# Patient Record
Sex: Male | Born: 1985 | Hispanic: No | Marital: Single | Smoking: Never smoker
Health system: Southern US, Community
[De-identification: ages and names within clinical notes are randomized; demographics above are authoritative.]

## PROBLEM LIST (undated history)

## (undated) DIAGNOSIS — K219 Gastro-esophageal reflux disease without esophagitis: Secondary | ICD-10-CM

## (undated) DIAGNOSIS — J342 Deviated nasal septum: Secondary | ICD-10-CM

## (undated) DIAGNOSIS — L649 Androgenic alopecia, unspecified: Secondary | ICD-10-CM

## (undated) DIAGNOSIS — J320 Chronic maxillary sinusitis: Secondary | ICD-10-CM

## (undated) DIAGNOSIS — J322 Chronic ethmoidal sinusitis: Secondary | ICD-10-CM

## (undated) DIAGNOSIS — J343 Hypertrophy of nasal turbinates: Secondary | ICD-10-CM

## (undated) HISTORY — PX: NO PAST SURGERIES: SHX2092

---

## 2013-02-24 ENCOUNTER — Emergency Department (HOSPITAL_COMMUNITY)
Admission: EM | Admit: 2013-02-24 | Discharge: 2013-02-24 | Disposition: A | Discharge status: Home / Self Care | Payer: BC Managed Care – PPO | Source: Home / Self Care | Attending: Emergency Medicine | Admitting: Emergency Medicine

## 2013-02-24 ENCOUNTER — Encounter (HOSPITAL_COMMUNITY): Payer: Self-pay | Admitting: Emergency Medicine

## 2013-02-24 DIAGNOSIS — L658 Other specified nonscarring hair loss: Secondary | ICD-10-CM

## 2013-02-24 DIAGNOSIS — L649 Androgenic alopecia, unspecified: Secondary | ICD-10-CM

## 2013-02-24 HISTORY — DX: Androgenic alopecia, unspecified: L64.9

## 2013-02-24 MED ORDER — FINASTERIDE 5 MG PO TABS: ORAL_TABLET | ORAL | Status: DC

## 2013-02-24 NOTE — ED Notes (Signed)
Requesting refill of Proscar.

## 2013-02-24 NOTE — ED Provider Notes (Signed)
Chief Complaint:   Chief Complaint  Patient presents with  . 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 Your ago he was given finasteride 5 mg which she takes one fourth tablet daily. He's been tolerating this well and this caused the hair loss to stop. He denies any side effects such as decreased libido or erectile dysfunction.  Review of Systems:  Other than noted above, the patient denies any of the following symptoms. Systemic:  No fever, chills, sweats, fatigue, myalgias, headache, or anorexia. Eye:  No redness, pain or drainage. ENT:  No earache, nasal congestion, rhinorrhea, sinus pressure, or sore throat. Lungs:  No cough, sputum production, wheezing, shortness of breath.  Cardiovascular:  No chest pain, palpitations, or syncope. GI:  No nausea, vomiting, abdominal pain or diarrhea. GU:  No dysuria, frequency, or hematuria. Skin:  No rash or pruritis.  PMFSH:  Past medical history, family history, social history, meds, and allergies were reviewed.   Physical Exam:   Vital signs:  BP 145/78  Pulse 55  Temp(Src) 98.5 F (36.9 C) (Oral)  Resp 16  SpO2 100% General:  Alert, in no distress. Eye:  PERRL, full EOMs.  Lids and conjunctivas were normal. ENT:  TMs and canals were normal, without erythema or inflammation.  Nasal mucosa was clear and uncongested, without drainage.  Mucous membranes were moist.  Pharynx was clear, without exudate or drainage.  There were no oral ulcerations or lesions. Neck:  Supple, no adenopathy, tenderness or mass. Thyroid was normal. Lungs:  No respiratory distress.  Lungs were clear to auscultation, without wheezes, rales or rhonchi.  Breath sounds were clear and equal bilaterally. Heart:  Regular rhythm, without gallops, murmers or rubs. Abdomen:  Soft, flat, and non-tender to palpation.  No hepatosplenomagaly or  mass. Skin:  Clear, warm, and dry, without rash or lesions.  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  Plan:   1.  The following meds were prescribed:   Discharge Medication List as of 02/24/2013  2:02 PM    START taking these medications   Details  finasteride (PROSCAR) 5 MG tablet 1/4 tablet daily, Normal       2.  The patient was instructed in symptomatic care and handouts were given. 3.  The patient was told to return to a primary care physician for refills.   Reuben Likes, MD 02/24/13 [phone]

## 2014-08-29 ENCOUNTER — Other Ambulatory Visit (INDEPENDENT_AMBULATORY_CARE_PROVIDER_SITE_OTHER): Payer: Self-pay | Admitting: Otolaryngology

## 2014-08-29 ENCOUNTER — Ambulatory Visit
Admission: RE | Admit: 2014-08-29 | Discharge: 2014-08-29 | Disposition: A | Discharge status: Home / Self Care | Payer: BC Managed Care – PPO | Source: Ambulatory Visit | Attending: Otolaryngology | Admitting: Otolaryngology

## 2014-08-29 DIAGNOSIS — J328 Other chronic sinusitis: Secondary | ICD-10-CM

## 2014-08-31 DIAGNOSIS — J343 Hypertrophy of nasal turbinates: Secondary | ICD-10-CM

## 2014-08-31 DIAGNOSIS — J342 Deviated nasal septum: Secondary | ICD-10-CM

## 2014-08-31 DIAGNOSIS — J320 Chronic maxillary sinusitis: Secondary | ICD-10-CM

## 2014-08-31 DIAGNOSIS — J322 Chronic ethmoidal sinusitis: Secondary | ICD-10-CM

## 2014-08-31 HISTORY — DX: Chronic maxillary sinusitis: J32.0

## 2014-08-31 HISTORY — DX: Chronic ethmoidal sinusitis: J32.2

## 2014-08-31 HISTORY — DX: Deviated nasal septum: J34.2

## 2014-08-31 HISTORY — DX: Hypertrophy of nasal turbinates: J34.3

## 2014-09-09 ENCOUNTER — Other Ambulatory Visit: Payer: Self-pay | Admitting: Otolaryngology

## 2014-09-19 ENCOUNTER — Encounter (HOSPITAL_BASED_OUTPATIENT_CLINIC_OR_DEPARTMENT_OTHER): Payer: Self-pay | Admitting: *Deleted

## 2014-09-29 ENCOUNTER — Ambulatory Visit (HOSPITAL_BASED_OUTPATIENT_CLINIC_OR_DEPARTMENT_OTHER): Payer: BC Managed Care – PPO | Admitting: Anesthesiology

## 2014-09-29 ENCOUNTER — Encounter (HOSPITAL_BASED_OUTPATIENT_CLINIC_OR_DEPARTMENT_OTHER): Payer: Self-pay | Admitting: Anesthesiology

## 2014-09-29 ENCOUNTER — Encounter (HOSPITAL_BASED_OUTPATIENT_CLINIC_OR_DEPARTMENT_OTHER)
Admission: RE | Disposition: A | Discharge status: Home / Self Care | Payer: Self-pay | Source: Ambulatory Visit | Attending: Otolaryngology

## 2014-09-29 ENCOUNTER — Ambulatory Visit (HOSPITAL_BASED_OUTPATIENT_CLINIC_OR_DEPARTMENT_OTHER)
Admission: RE | Admit: 2014-09-29 | Discharge: 2014-09-29 | Disposition: A | Discharge status: Home / Self Care | Payer: BC Managed Care – PPO | Source: Ambulatory Visit | Attending: Otolaryngology | Admitting: Otolaryngology

## 2014-09-29 DIAGNOSIS — R0981 Nasal congestion: Secondary | ICD-10-CM | POA: Diagnosis not present

## 2014-09-29 DIAGNOSIS — J321 Chronic frontal sinusitis: Secondary | ICD-10-CM | POA: Insufficient documentation

## 2014-09-29 DIAGNOSIS — Z792 Long term (current) use of antibiotics: Secondary | ICD-10-CM | POA: Diagnosis not present

## 2014-09-29 DIAGNOSIS — J320 Chronic maxillary sinusitis: Secondary | ICD-10-CM | POA: Diagnosis not present

## 2014-09-29 DIAGNOSIS — J328 Other chronic sinusitis: Secondary | ICD-10-CM | POA: Insufficient documentation

## 2014-09-29 DIAGNOSIS — J342 Deviated nasal septum: Secondary | ICD-10-CM | POA: Insufficient documentation

## 2014-09-29 DIAGNOSIS — J343 Hypertrophy of nasal turbinates: Secondary | ICD-10-CM | POA: Insufficient documentation

## 2014-09-29 DIAGNOSIS — J322 Chronic ethmoidal sinusitis: Secondary | ICD-10-CM | POA: Diagnosis not present

## 2014-09-29 HISTORY — PX: NASAL SEPTOPLASTY W/ TURBINOPLASTY: SHX2070

## 2014-09-29 HISTORY — PX: SINUS ENDO WITH FUSION: SHX5329

## 2014-09-29 HISTORY — DX: Gastro-esophageal reflux disease without esophagitis: K21.9

## 2014-09-29 HISTORY — PX: SINUS TREPHINING FRONTAL: SHX5216

## 2014-09-29 HISTORY — DX: Chronic ethmoidal sinusitis: J32.2

## 2014-09-29 HISTORY — DX: Chronic maxillary sinusitis: J32.0

## 2014-09-29 HISTORY — DX: Hypertrophy of nasal turbinates: J34.3

## 2014-09-29 HISTORY — DX: Deviated nasal septum: J34.2

## 2014-09-29 LAB — POCT HEMOGLOBIN-HEMACUE: HEMOGLOBIN: 14.5 g/dL (ref 13.0–17.0)

## 2014-09-29 SURGERY — SEPTOPLASTY, NOSE, WITH NASAL TURBINATE REDUCTION
Anesthesia: General | Site: Nose

## 2014-09-29 MED ORDER — FENTANYL CITRATE 0.05 MG/ML IJ SOLN
INTRAMUSCULAR | Status: DC | PRN
  Administered 2014-09-29: 100 ug via INTRAVENOUS

## 2014-09-29 MED ORDER — MIDAZOLAM HCL 2 MG/2ML IJ SOLN
INTRAMUSCULAR | Status: AC
  Filled 2014-09-29: qty 2

## 2014-09-29 MED ORDER — BACITRACIN ZINC 500 UNIT/GM EX OINT
TOPICAL_OINTMENT | CUTANEOUS | Status: AC
Start: 2014-09-29 — End: 2014-09-29
  Filled 2014-09-29: qty 28.35

## 2014-09-29 MED ORDER — OXYMETAZOLINE HCL 0.05 % NA SOLN
NASAL | Status: DC | PRN
  Administered 2014-09-29: 1 via NASAL

## 2014-09-29 MED ORDER — OXYCODONE HCL 5 MG PO TABS
ORAL_TABLET | ORAL | Status: AC
  Filled 2014-09-29: qty 1

## 2014-09-29 MED ORDER — OXYCODONE HCL 5 MG/5ML PO SOLN: 5.0000 mg | Freq: Once | ORAL | Status: AC | PRN

## 2014-09-29 MED ORDER — PROPOFOL 10 MG/ML IV BOLUS
INTRAVENOUS | Status: DC | PRN
  Administered 2014-09-29: 200 mg via INTRAVENOUS

## 2014-09-29 MED ORDER — HYDROMORPHONE HCL 1 MG/ML IJ SOLN
0.2500 mg | INTRAMUSCULAR | Status: DC | PRN
  Administered 2014-09-29: 0.5 mg via INTRAVENOUS

## 2014-09-29 MED ORDER — MIDAZOLAM HCL 5 MG/5ML IJ SOLN
INTRAMUSCULAR | Status: DC | PRN
  Administered 2014-09-29: 2 mg via INTRAVENOUS

## 2014-09-29 MED ORDER — OXYCODONE-ACETAMINOPHEN 5-325 MG PO TABS: 1.0000 | ORAL_TABLET | ORAL | Status: AC | PRN

## 2014-09-29 MED ORDER — SUCCINYLCHOLINE CHLORIDE 20 MG/ML IJ SOLN
INTRAMUSCULAR | Status: DC | PRN
  Administered 2014-09-29: 100 mg via INTRAVENOUS

## 2014-09-29 MED ORDER — HYDROMORPHONE HCL 1 MG/ML IJ SOLN
INTRAMUSCULAR | Status: AC
  Filled 2014-09-29: qty 1

## 2014-09-29 MED ORDER — MIDAZOLAM HCL 2 MG/ML PO SYRP: 12.0000 mg | ORAL_SOLUTION | Freq: Once | ORAL | Status: DC | PRN

## 2014-09-29 MED ORDER — MUPIROCIN 2 % EX OINT
TOPICAL_OINTMENT | CUTANEOUS | Status: AC
  Filled 2014-09-29: qty 22

## 2014-09-29 MED ORDER — SODIUM CHLORIDE 0.9 % IV SOLN
INTRAVENOUS | Status: DC | PRN
  Administered 2014-09-29: 500 mL via INTRAMUSCULAR

## 2014-09-29 MED ORDER — MUPIROCIN 2 % EX OINT
TOPICAL_OINTMENT | CUTANEOUS | Status: DC | PRN
  Administered 2014-09-29: 1 via NASAL

## 2014-09-29 MED ORDER — FENTANYL CITRATE 0.05 MG/ML IJ SOLN
INTRAMUSCULAR | Status: AC
  Filled 2014-09-29: qty 4

## 2014-09-29 MED ORDER — CEFAZOLIN SODIUM-DEXTROSE 2-3 GM-% IV SOLR
INTRAVENOUS | Status: DC | PRN
  Administered 2014-09-29: 2 g via INTRAVENOUS

## 2014-09-29 MED ORDER — OXYMETAZOLINE HCL 0.05 % NA SOLN
NASAL | Status: AC
  Filled 2014-09-29: qty 15

## 2014-09-29 MED ORDER — PROMETHAZINE HCL 25 MG/ML IJ SOLN: 6.2500 mg | INTRAMUSCULAR | Status: DC | PRN

## 2014-09-29 MED ORDER — MEPERIDINE HCL 25 MG/ML IJ SOLN: 6.2500 mg | INTRAMUSCULAR | Status: DC | PRN

## 2014-09-29 MED ORDER — DIPHENHYDRAMINE HCL 50 MG/ML IJ SOLN: 12.5000 mg | Freq: Once | INTRAMUSCULAR | Status: DC

## 2014-09-29 MED ORDER — LIDOCAINE-EPINEPHRINE 1 %-1:100000 IJ SOLN
INTRAMUSCULAR | Status: DC | PRN
  Administered 2014-09-29: 3 mL

## 2014-09-29 MED ORDER — AMOXICILLIN 875 MG PO TABS: 875.0000 mg | ORAL_TABLET | Freq: Two times a day (BID) | ORAL | Status: AC

## 2014-09-29 MED ORDER — OXYCODONE HCL 5 MG PO TABS
5.0000 mg | ORAL_TABLET | Freq: Once | ORAL | Status: AC | PRN
  Administered 2014-09-29: 5 mg via ORAL

## 2014-09-29 MED ORDER — DEXAMETHASONE SODIUM PHOSPHATE 4 MG/ML IJ SOLN
INTRAMUSCULAR | Status: DC | PRN
  Administered 2014-09-29: 10 mg via INTRAVENOUS

## 2014-09-29 MED ORDER — CEFAZOLIN SODIUM-DEXTROSE 2-3 GM-% IV SOLR
INTRAVENOUS | Status: AC
  Filled 2014-09-29: qty 50

## 2014-09-29 MED ORDER — ONDANSETRON HCL 4 MG/2ML IJ SOLN
INTRAMUSCULAR | Status: DC | PRN
  Administered 2014-09-29: 4 mg via INTRAVENOUS

## 2014-09-29 MED ORDER — LIDOCAINE-EPINEPHRINE 1 %-1:100000 IJ SOLN
INTRAMUSCULAR | Status: AC
  Filled 2014-09-29: qty 1

## 2014-09-29 MED ORDER — MIDAZOLAM HCL 2 MG/2ML IJ SOLN
1.0000 mg | INTRAMUSCULAR | Status: DC | PRN
Start: 2014-09-29 — End: 2014-09-29

## 2014-09-29 MED ORDER — MIDAZOLAM HCL 2 MG/2ML IJ SOLN: 0.5000 mg | Freq: Once | INTRAMUSCULAR | Status: DC | PRN

## 2014-09-29 MED ORDER — LACTATED RINGERS IV SOLN
INTRAVENOUS | Status: DC
  Administered 2014-09-29 (×3): via INTRAVENOUS

## 2014-09-29 MED ORDER — FENTANYL CITRATE 0.05 MG/ML IJ SOLN: 50.0000 ug | INTRAMUSCULAR | Status: DC | PRN

## 2014-09-29 MED ORDER — LIDOCAINE HCL (CARDIAC) 20 MG/ML IV SOLN
INTRAVENOUS | Status: DC | PRN
  Administered 2014-09-29: 50 mg via INTRAVENOUS

## 2014-09-29 SURGICAL SUPPLY — 62 items
ATTRACTOMAT 16X20 MAGNETIC DRP (DRAPES) IMPLANT
BLADE ROTATE RAD 12 4 M4 (BLADE) IMPLANT
BLADE ROTATE RAD 12 4MM M4 (BLADE)
BLADE ROTATE RAD 40 4 M4 (BLADE) IMPLANT
BLADE ROTATE RAD 40 4MM M4 (BLADE)
BLADE ROTATE TRICUT 4MX13CM M4 (BLADE) ×1
BLADE ROTATE TRICUT 4X13 M4 (BLADE) ×4 IMPLANT
BLADE TRICUT ROTATE M4 4 5PK (BLADE) IMPLANT
BLADE TRICUT ROTATE M4 4MM 5PK (BLADE)
BUR HS RAD FRONTAL 3 (BURR) IMPLANT
BUR HS RAD FRONTAL 3MM (BURR)
CANISTER SUC SOCK COL 7IN (MISCELLANEOUS) IMPLANT
CANISTER SUCT 1200ML W/VALVE (MISCELLANEOUS) ×5 IMPLANT
COAGULATOR SUCT 8FR VV (MISCELLANEOUS) ×5 IMPLANT
DECANTER SPIKE VIAL GLASS SM (MISCELLANEOUS) IMPLANT
DRAPE SURG 17X23 STRL (DRAPES) ×5 IMPLANT
DRSG NASAL KENNEDY LMNT 8CM (GAUZE/BANDAGES/DRESSINGS) IMPLANT
DRSG NASOPORE 8CM (GAUZE/BANDAGES/DRESSINGS) ×5 IMPLANT
DRSG TELFA 3X8 NADH (GAUZE/BANDAGES/DRESSINGS) IMPLANT
ELECT REM PT RETURN 9FT ADLT (ELECTROSURGICAL) ×5
ELECTRODE REM PT RTRN 9FT ADLT (ELECTROSURGICAL) ×3 IMPLANT
GLOVE BIO SURGEON STRL SZ7.5 (GLOVE) ×5 IMPLANT
GLOVE BIOGEL PI IND STRL 7.0 (GLOVE) ×3 IMPLANT
GLOVE BIOGEL PI INDICATOR 7.0 (GLOVE) ×2
GLOVE ECLIPSE 7.0 STRL STRAW (GLOVE) ×5 IMPLANT
GLOVE ECLIPSE 7.5 STRL STRAW (GLOVE) ×5 IMPLANT
GOWN STRL REUS W/ TWL LRG LVL3 (GOWN DISPOSABLE) ×6 IMPLANT
GOWN STRL REUS W/TWL LRG LVL3 (GOWN DISPOSABLE) ×4
HEMOSTAT SURGICEL 2X14 (HEMOSTASIS) IMPLANT
IV NS 1000ML (IV SOLUTION)
IV NS 1000ML BAXH (IV SOLUTION) IMPLANT
IV NS 500ML (IV SOLUTION) ×2
IV NS 500ML BAXH (IV SOLUTION) ×3 IMPLANT
NEEDLE 27GAX1X1/2 (NEEDLE) ×5 IMPLANT
NEEDLE HYPO 25X1 1.5 SAFETY (NEEDLE) ×5 IMPLANT
NEEDLE SPNL 25GX3.5 QUINCKE BL (NEEDLE) IMPLANT
NS IRRIG 1000ML POUR BTL (IV SOLUTION) ×5 IMPLANT
PACK BASIN DAY SURGERY FS (CUSTOM PROCEDURE TRAY) ×5 IMPLANT
PACK ENT DAY SURGERY (CUSTOM PROCEDURE TRAY) ×5 IMPLANT
PAD ENT ADHESIVE 25PK (MISCELLANEOUS) ×5 IMPLANT
SLEEVE SCD COMPRESS KNEE MED (MISCELLANEOUS) ×5 IMPLANT
SOLUTION BUTLER CLEAR DIP (MISCELLANEOUS) ×5 IMPLANT
SPLINT NASAL AIRWAY SILICONE (MISCELLANEOUS) ×5 IMPLANT
SPONGE GAUZE 2X2 8PLY STER LF (GAUZE/BANDAGES/DRESSINGS) ×1
SPONGE GAUZE 2X2 8PLY STRL LF (GAUZE/BANDAGES/DRESSINGS) ×4 IMPLANT
SPONGE NEURO XRAY DETECT 1X3 (DISPOSABLE) ×5 IMPLANT
SUT CHROMIC 4 0 P 3 18 (SUTURE) ×5 IMPLANT
SUT ETHILON 3 0 PS 1 (SUTURE) IMPLANT
SUT PLAIN 4 0 ~~LOC~~ 1 (SUTURE) ×5 IMPLANT
SUT PROLENE 3 0 PS 2 (SUTURE) ×5 IMPLANT
SUT VIC AB 4-0 P-3 18XBRD (SUTURE) IMPLANT
SUT VIC AB 4-0 P3 18 (SUTURE)
SYR 3ML 23GX1 SAFETY (SYRINGE) IMPLANT
TOWEL OR 17X24 6PK STRL BLUE (TOWEL DISPOSABLE) ×10 IMPLANT
TRACKER ENT INSTRUMENT (MISCELLANEOUS) ×5 IMPLANT
TRACKER ENT PATIENT (MISCELLANEOUS) ×5 IMPLANT
TUBE CONNECTING 20'X1/4 (TUBING) ×1
TUBE CONNECTING 20X1/4 (TUBING) ×4 IMPLANT
TUBE SALEM SUMP 12R W/ARV (TUBING) IMPLANT
TUBE SALEM SUMP 16 FR W/ARV (TUBING) ×5 IMPLANT
TUBING STRAIGHTSHOT EPS 5PK (TUBING) ×5 IMPLANT
YANKAUER SUCT BULB TIP NO VENT (SUCTIONS) ×5 IMPLANT

## 2014-09-29 NOTE — Brief Op Note (Signed)
09/29/2014  11:01 AM  PATIENT:  [name] Booker  28 y.o. male  PRE-OPERATIVE DIAGNOSIS:   1. Nasal septal deviation 2. Chronic left frontal sinusitis 3. Chronic bilateral ethmoid sinusitis 4. Chronic bilateral maxillary sinusitis 5. Bilateral inferior turbinate hypertropy  POST-OPERATIVE DIAGNOSIS:  1. Nasal septal deviation 2. Chronic left frontal sinusitis 3. Chronic bilateral ethmoid sinusitis 4. Chronic bilateral maxillary sinusitis 5. Bilateral inferior turbinate hypertropy  PROCEDURE:   1. Endoscopic left frontal sinusotomy  2. Septoplasty 3. Bilateral partial inferior turbinate resection 4. Endoscopic bilateral maxillary antrostomy with polyp removal 5. Endoscopic bilateral anterior ethmoidectomy 6. FUSION stereotactic image guidance  SURGEON:  Surgeon(s) and Role:    * Darletta MollSui W Kashden Deboy, MD - Primary  PHYSICIAN ASSISTANT:   ASSISTANTS: none   ANESTHESIA:   general  EBL:  Total I/O In: 1500 [I.V.:1500] Out: -   BLOOD ADMINISTERED:none  DRAINS: none   LOCAL MEDICATIONS USED:  LIDOCAINE   SPECIMEN:  Source of Specimen:  Bilateral sinus contents  DISPOSITION OF SPECIMEN:  PATHOLOGY  COUNTS:  YES  TOURNIQUET:  * No tourniquets in log *  DICTATION: .Other Dictation: Dictation Number [id number]  PLAN OF CARE: Discharge to home after PACU  PATIENT DISPOSITION:  PACU - hemodynamically stable.   Delay start of Pharmacological VTE agent (>24hrs) due to surgical blood loss or risk of bleeding: not applicable

## 2014-09-29 NOTE — Anesthesia Preprocedure Evaluation (Addendum)
Anesthesia Evaluation  Patient identified by MRN, date of birth, ID band Patient awake    Reviewed: Allergy & Precautions, H&P , NPO status , Patient's Chart, lab work & pertinent test results  History of Anesthesia Complications Negative for: history of anesthetic complications  Airway Mallampati: I  TM Distance: >3 FB Neck ROM: Full    Dental  (+) Teeth Intact, Dental Advisory Given   Pulmonary  Chronic sinusitis breath sounds clear to auscultation        Cardiovascular negative cardio ROS  Rhythm:Regular Rate:Normal     Neuro/Psych negative neurological ROS     GI/Hepatic Neg liver ROS, GERD-  Medicated and Controlled,  Endo/Other  negative endocrine ROS  Renal/GU negative Renal ROS     Musculoskeletal   Abdominal   Peds  Hematology negative hematology ROS (+)   Anesthesia Other Findings   Reproductive/Obstetrics                            Anesthesia Physical Anesthesia Plan  ASA: II  Anesthesia Plan: General   Post-op Pain Management:    Induction: Intravenous  Airway Management Planned: Oral ETT  Additional Equipment:   Intra-op Plan:   Post-operative Plan: Extubation in OR  Informed Consent: I have reviewed the patients History and Physical, chart, labs and discussed the procedure including the risks, benefits and alternatives for the proposed anesthesia with the patient or authorized representative who has indicated his/her understanding and acceptance.   Dental advisory given  Plan Discussed with: Surgeon and CRNA  Anesthesia Plan Comments: (Plan routine monitors, GETA)        Anesthesia Quick Evaluation

## 2014-09-29 NOTE — H&P (Signed)
Cc: Chronic nasal congestion, chronic rhinosinusitis  HPI: The patient is a 28 year old male who returns today for his follow-up evaluation.  The patient has a history of chronic nasal congestion and chronic rhinosinusitis, with recurrent acute symptoms.  He was previously treated with multiple courses of long-term antibiotics and steroids.  However, he continues to be symptomatic.  He recently underwent a sinus CT scan.  The CT showed significant nasal septal deviation, bilateral inferior turbinate hypertrophy, and mucosal thickening and obstruction of the left frontal recess, bilateral anterior ethmoid air cells, and bilateral maxillary openings.  The patient returns reporting no significant improvement in his symptoms.  He continues to have chronic nasal congestion and postnasal drainage.  He is interested in more definitive treatment of his symptoms.   Exam: The nasal cavities were decongested and anesthetised with a combination of oxymetazoline and 4% lidocaine solution.  The flexible scope was inserted into the right nasal cavity.  Endoscopy of the inferior and middle meatus was performed.  Edematous mucosa was noted. Bidirectional nasal septal deviation.  Olfactory cleft was clear.  Nasopharynx was clear.  Turbinates were hypertrophied but without mass.  Incomplete response to decongestion.  The procedure was repeated on the contralateral side with similar findings.  The patient tolerated the procedure well.  Instructions were given to avoid eating or drinking for 2 hours.    Assessment: 1.  The patient continues to have severe nasal mucosal congestion, bidirectional nasal septal deviation, and bilateral inferior turbinate hypertrophy.   2.  His CT scan images are consistent with bilateral chronic maxillary, ethmoid, and left frontal sinusitis.   Plan: 1.  The nasal endoscopy findings and the CT images are extensively discussed with the patient.  2.  The patient should continue with his allergy  treatment regimen.   3.  The options of surgical intervention with septoplasty, turbinate reduction, and endoscopic sinus surgery are discussed.    4.  The risks, benefits, alternatives and details of the procedure are reviewed.  5.  The patient is interested in proceeding with the procedures.  We will schedule the procedure in accordance with the patient's schedule.

## 2014-09-29 NOTE — Discharge Instructions (Signed)
°Post Anesthesia Home Care Instructions ° °Activity: °Get plenty of rest for the remainder of the day. A responsible adult should stay with you for 24 hours following the procedure.  °For the next 24 hours, DO NOT: °-Drive a car °-Operate machinery °-Drink alcoholic beverages °-Take any medication unless instructed by your physician °-Make any legal decisions or sign important papers. ° °Meals: °Start with liquid foods such as gelatin or soup. Progress to regular foods as tolerated. Avoid greasy, spicy, heavy foods. If nausea and/or vomiting occur, drink only clear liquids until the nausea and/or vomiting subsides. Call your physician if vomiting continues. ° °Special Instructions/Symptoms: °Your throat may feel dry or sore from the anesthesia or the breathing tube placed in your throat during surgery. If this causes discomfort, gargle with warm salt water. The discomfort should disappear within 24 hours. ° °-------------------- ° °POSTOPERATIVE INSTRUCTIONS FOR PATIENTS HAVING NASAL OR SINUS OPERATIONS °ACTIVITY: Restrict activity at home for the first two days, resting as much as possible. Light activity is best. You may usually return to work within a week. You should refrain from nose blowing, strenuous activity, or heavy lifting greater than 20lbs for a total of three weeks after your operation.  If sneezing cannot be avoided, sneeze with your mouth open. °DISCOMFORT: You may experience a dull headache and pressure along with nasal congestion and discharge. These symptoms may be worse during the first week after the operation but may last as long as two to four weeks.  Please take Tylenol or the pain medication that has been prescribed for you. Do not take aspirin or aspirin containing medications since they may cause bleeding.  You may experience symptoms of post nasal drainage, nasal congestion, headaches and fatigue for two or three months after your operation.  °BLEEDING: You may have some blood tinged  nasal drainage for approximately two weeks after the operation.  The discharge will be worse for the first week.  Please call our office at [phone] or go to the nearest hospital emergency room if you experience any of the following: heavy, bright red blood from your nose or mouth that lasts longer than ten minutes or coughing up or vomiting bright red blood or blood clots. °GENERAL CONSIDERATIONS: °1. A gauze dressing will be placed on your upper lip to absorb any drainage after the operation. You may need to change this several times a day.  If you do not have very much drainage, you may remove the dressing.  Remember that you may gently wipe your nose with a tissue and sniff in, but DO NOT blow your nose. °2. Please keep all of your postoperative appointments.  Your final results after the operation will depend on proper follow-up.  The initial visit is usually four to seven days after the operation.  During this visit, the remaining nasal packing and internal septal splints will be removed.  Your nasal and sinus cavities will be cleaned.  During the second visit, your nasal and sinus cavities will be cleaned again. Have someone drive you to your first two postoperative appointments. We suggest that you take your prescribed pain medication about ½ hour prior to each of these two appointments.  °3. How you care for your nose after the operation will influence the results that you obtain.  You should follow all directions, take your medication as prescribed, and call our office [phone] with any problems or questions. °4. You may be more comfortable sleeping with your head elevated on two pillows. °5. Do   not take any medications that we have not prescribed or recommended. °WARNING SIGNS: if any of the following should occur, please call our office: °1. Bright red bleeding which lasts more than 10 minutes. °2. Persistent fever greater than 102F. °3. Persistent vomiting. °4. Severe and constant pain that is  not relieved by prescribed pain medication. °5. Trauma to the nose. °6. Rash or unusual side effects from any medicines. ° °

## 2014-09-29 NOTE — Anesthesia Procedure Notes (Signed)
Procedure Name: Intubation Date/Time: 09/29/2014 8:42 AM Performed by: Caren MacadamARTER, Adylin Hankey W Pre-anesthesia Checklist: Patient identified, Emergency Drugs available, Suction available and Patient being monitored Patient Re-evaluated:Patient Re-evaluated prior to inductionOxygen Delivery Method: Circle System Utilized Preoxygenation: Pre-oxygenation with 100% oxygen Intubation Type: IV induction Ventilation: Mask ventilation without difficulty Laryngoscope Size: Miller and 2 Grade View: Grade I Tube type: Oral Tube size: 7.0 mm Number of attempts: 1 Airway Equipment and Method: stylet and oral airway Placement Confirmation: ETT inserted through vocal cords under direct vision,  positive ETCO2 and breath sounds checked- equal and bilateral Secured at: 22 cm Tube secured with: Tape Dental Injury: Teeth and Oropharynx as per pre-operative assessment

## 2014-09-29 NOTE — Transfer of Care (Signed)
Immediate Anesthesia Transfer of Care Note  Patient: [name] Booker  Procedure(s) Performed: Procedure(s): NASAL SEPTOPLASTY WITH TURBINATE REDUCTION (N/A) SINUS ENDO WITH FUSION, BILATERAL ETHMOIDECTOMY AND BILATERAL MAXILLARY ANTROSTOMY (Bilateral) LEFT SINUS TREPHINING FRONTAL SINUS (Left)  Patient Location: PACU  Anesthesia Type:General  Level of Consciousness: awake and alert   Airway & Oxygen Therapy: Patient Spontanous Breathing and Patient connected to face mask oxygen  Post-op Assessment: Report given to PACU RN and Post -op Vital signs reviewed and stable  Post vital signs: Reviewed and stable  Complications: No apparent anesthesia complications

## 2014-09-29 NOTE — Anesthesia Postprocedure Evaluation (Signed)
  Anesthesia Post-op Note  Patient: Geneticist, molecularMahesh Mault  Procedure(s) Performed: Procedure(s): NASAL SEPTOPLASTY WITH TURBINATE REDUCTION (N/A) SINUS ENDO WITH FUSION, BILATERAL ETHMOIDECTOMY AND BILATERAL MAXILLARY ANTROSTOMY (Bilateral) LEFT SINUS TREPHINING FRONTAL SINUS (Left)  Patient Location: PACU  Anesthesia Type:General  Level of Consciousness: awake, alert , oriented and patient cooperative  Airway and Oxygen Therapy: Patient Spontanous Breathing  Post-op Pain: 4 /10, mild  Post-op Assessment: Post-op Vital signs reviewed, Patient's Cardiovascular Status Stable, Respiratory Function Stable, Patent Airway, No signs of Nausea or vomiting and Pain level controlled  Post-op Vital Signs: Reviewed and stable  Last Vitals:  Filed Vitals:   09/29/14 1300  BP: 146/95  Pulse: 67  Temp: 36.6 C  Resp: 18    Complications: No apparent anesthesia complications

## 2014-09-30 ENCOUNTER — Encounter (HOSPITAL_BASED_OUTPATIENT_CLINIC_OR_DEPARTMENT_OTHER): Payer: Self-pay | Admitting: Otolaryngology

## 2014-09-30 NOTE — Op Note (Signed)
NAMJocelyn Booker:  Booker, [name]        ACCOUNT NO.:  [id number]  MEDICAL RECORD NO.:  [id number]  LOCATION:                                 FACILITY:  PHYSICIAN:  Newman PiesSu Helaine Yackel, MD            DATE OF BIRTH:  11/10/1985  DATE OF PROCEDURE:  09/29/2014 DATE OF DISCHARGE:  09/29/2014                              OPERATIVE REPORT   SURGEON:  Newman PiesSu Carinne Brandenburger, MD  PREOPERATIVE DIAGNOSES: 1. Chronic nasal obstruction. 2. Septal deviation. 3. Bilateral inferior turbinate hypertrophy. 4. Chronic left frontal sinusitis. 5. Chronic bilateral ethmoid sinusitis. 6. Chronic bilateral maxillary sinusitis.  POSTOPERATIVE DIAGNOSES: 1. Chronic nasal obstruction. 2. Septal deviation. 3. Bilateral inferior turbinate hypertrophy. 4. Chronic left frontal sinusitis. 5. Chronic bilateral ethmoid sinusitis. 6. Chronic bilateral maxillary sinusitis.  PROCEDURES PERFORMED: 1. Endoscopic left frontal sinusotomy. 2. Septoplasty. 3. Bilateral partial inferior turbinate resection. 4. Endoscopic bilateral maxillary antrostomy with polyp removal. 5. Endoscopic bilateral anterior ethmoidectomy. 6. Fusion stereotactic image guidance.  ANESTHESIA:  General endotracheal tube anesthesia.  COMPLICATIONS:  None.  ESTIMATED BLOOD LOSS:  Less than 200 mL.  INDICATION FOR PROCEDURE:  The patient is a 28 year old male with history of chronic nasal congestion and chronic rhinosinusitis.  He was previously treated with multiple courses of long-term antibiotics and steroids.  However, he continues to be symptomatic.  He recently underwent a sinus CT scan.  The CT showed a significant nasal septal deviation, bilateral inferior turbinate hypertrophy, mucosal thickening and obstruction of the left frontal recess, bilateral anterior ethmoid air cells, and bilateral maxillary openings.  Based on the above findings, the decision was made for the patient to undergo the above- stated procedures.  The risks, benefits, alternatives, and  details of the procedures were discussed with the patient.  Questions were invited and answered.  Informed consent was obtained.  It should also be noted that the patient was previously seen by an allergist, and was treated with allergy medications without significant improvement in his symptoms.  DESCRIPTION OF PROCEDURE:  The patient was taken to the operating room and placed supine on the operating table.  General endotracheal tube anesthesia was administered by the anesthesiologist.  Preop IV antibiotics and Decadron were given.  The patient was positioned and prepped and draped in a standard fashion for nasal surgery.  The Fusion image guidance markers were placed.  The image guidance system was functional throughout the case.  Pledgets soaked with Afrin were placed in both nasal cavities for vasoconstriction.  The pledgets were subsequently removed.  Endoscopic examination of the nasal cavities revealed significant nasal septal deviation, bilateral inferior turbinate hypertrophy, and polypoid tissue obstructing both middle meatus.  Attention was first focused on the septum.  A 1% lidocaine with 1:100,000 epinephrine was injected onto the nasal septum bilaterally.  A standard hemitransfixion incision was made on the left side.  The mucosal flap was elevated in a standard fashion on the left side. Cartilaginous incision was made 1 cm superior to the caudal margin of the septum.  The contralateral mucosal flap was also elevated.  The deviated portion of the cartilaginous and bony nasal septum were removed.  The cartilage was morselized and replaced.  The septum was  quilted with 4-0 plain gut sutures.  The hemitransfixion incision was closed with interrupted chromic sutures.  Attention was then focused on the inferior turbinates.  The inferior one- half of each inferior turbinate was clamped with a straight Kelly clamp. The inferior one-half of each inferior turbinate was then  resected with a pair of cross-cutting scissors.  Hemostasis was achieved with suction electrocautery device.  Attention was then focused on the left paranasal sinuses.  The left middle turbinate was carefully medialized.  Polypoid tissue was noted within the middle meatus.  The polypoid tissue was removed.  The removed tissue was sent to the Pathology Department for permanent histologic identification.  The uncinate process was then removed with a Therapist, nutritionalreer elevator.  Polypoid tissue was removed from the maxillary antrum.  The maxillary opening was widely enlarged.  Mucoid drainage was suctioned from the maxillary sinus.  The anterior ethmoid cavity was then opened. Polypoid tissue was also removed from the anterior ethmoid cavity.  The left frontal recess was then carefully entered and enlarged.  Polypoid tissue was removed from the inferior aspect of the left frontal sinus. The left frontal sinus was then copiously irrigated.  Attention was then focused on the right side.  The same procedure was performed to the right maxillary and ethmoid sinuses.  Hemostasis of the paranasal sinuses were achieved with NasoPore packing.  That concluded procedure for the patient.  The care of the patient was turned over to the anesthesiologist.  The patient was awakened from anesthesia without difficulty.  He was extubated and transferred to the recovery room in good condition.  OPERATIVE FINDINGS: 1. Severe nasal septal deviation to the left. 2. Bilateral inferior turbinate hypertrophy. 3. Bilateral chronic rhinosinusitis, with polypoid tissue obstructing     both middle meatus.  Photodocumentation of the polypoid tissue was     obtained.  SPECIMEN:  Bilateral sinus contents.  FOLLOWUP CARE:  The patient will be discharged home once he is awake and alert.  He will be placed on Percocet p.r.n. pain, and amoxicillin for 5 days.  The patient will follow up in my office in 5 days.     Newman PiesSu Robi Mitter,  MD     ST/MEDQ  D:  09/29/2014  T:  09/29/2014  Job:  [id number]

## 2016-05-25 IMAGING — CT CT MAXILLOFACIAL W/O CM
2 series · 16 of 30 positions shown, 19 images · non-contrast
Comparison: None.

CLINICAL DATA: Chronic sinusitis.

EXAM:
CT MAXILLOFACIAL WITHOUT CONTRAST
TECHNIQUE: Multidetector CT imaging of the maxillofacial structures was
performed. Multiplanar CT image reconstructions were also generated.
A small metallic BB was placed on the right temple in order to
reliably differentiate right from left.

[Series 3: axial soft 1.25 · axial · 0.49mm/px · z∈[-43,+59]mm · 6 of 140 slices shown]
[im 17/140  brain]
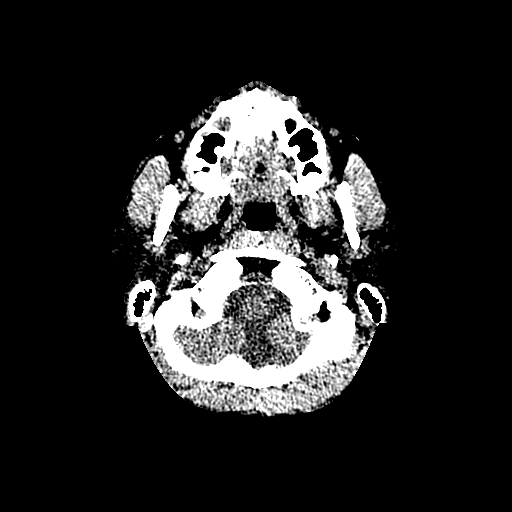
[im 33/140  brain]
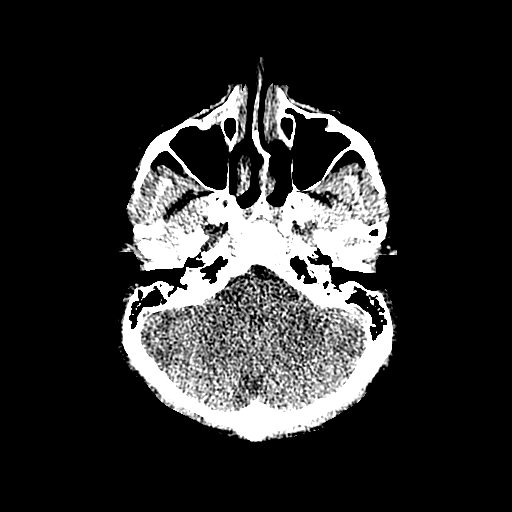
[im 50/140  brain]
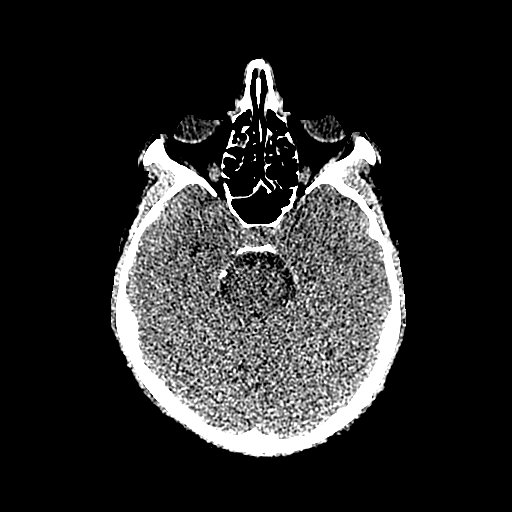
[im 66/140  brain]
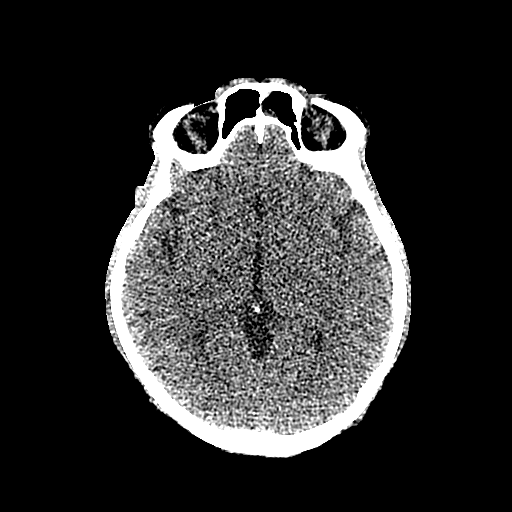
[im 82/140  brain]
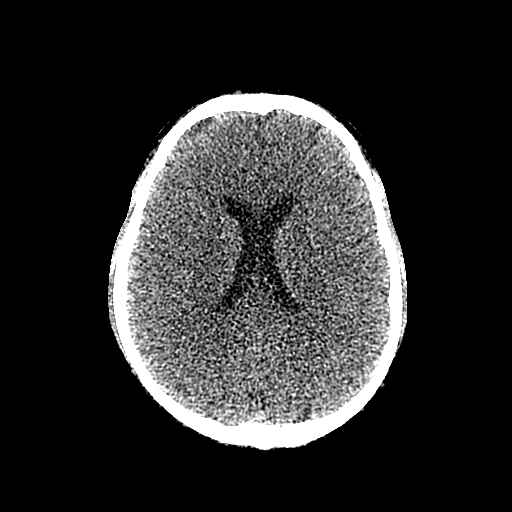
[im 99/140  brain]
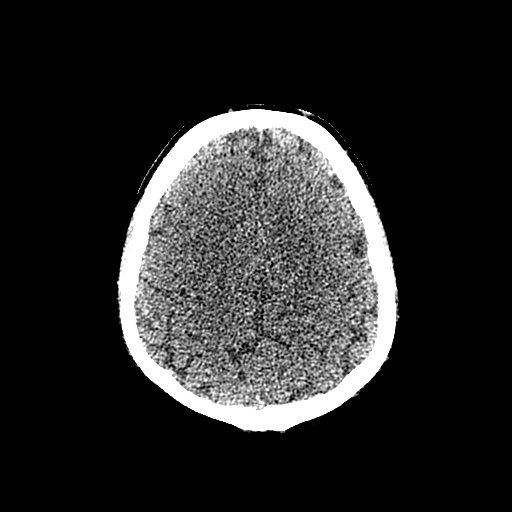

[Series 602: sagittal facial · sagittal · 0.49mm/px · 10 of 91 slices shown, 13 images]
[im 9/91  brain]
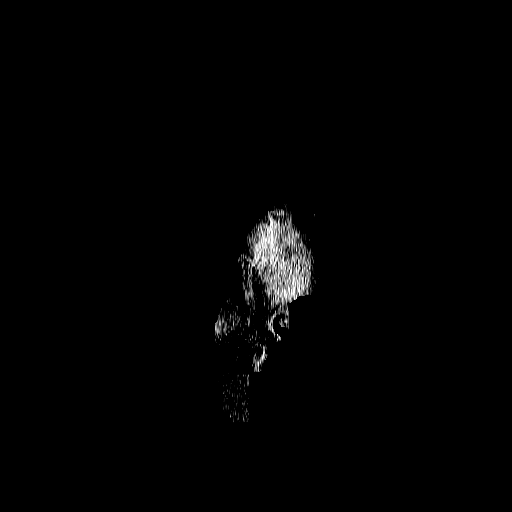
[im 9/91  bone]
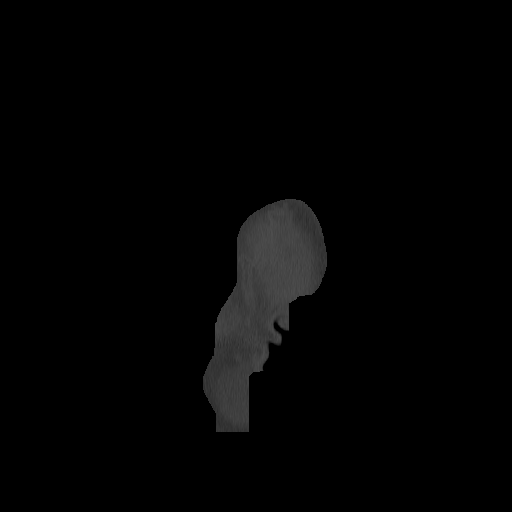
[im 17/91  bone]
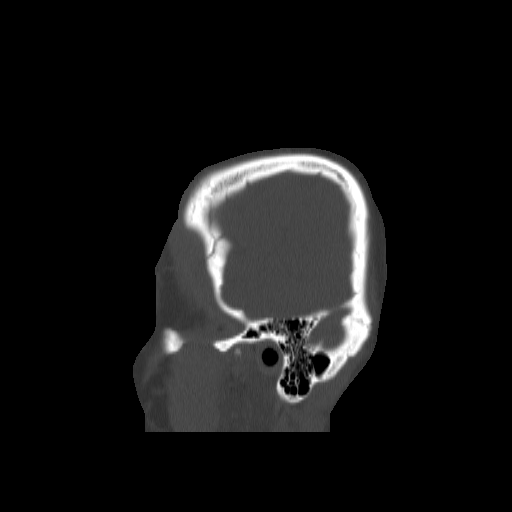
[im 25/91  bone]
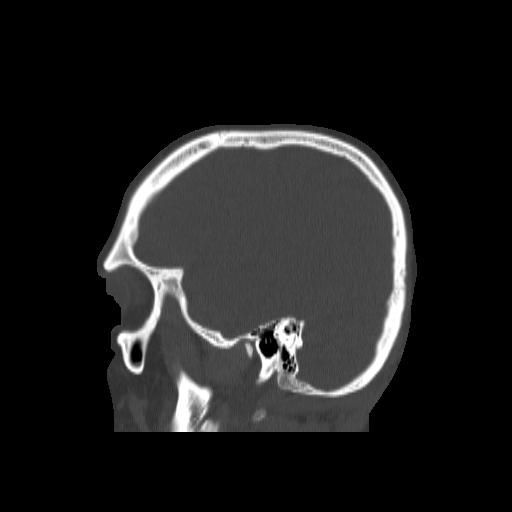
[im 33/91  bone]
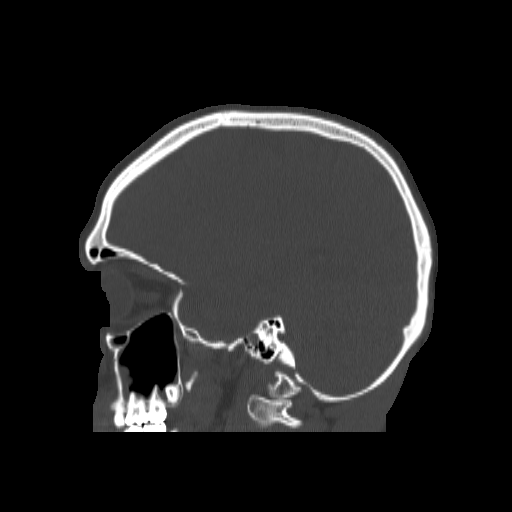
[im 41/91  brain]
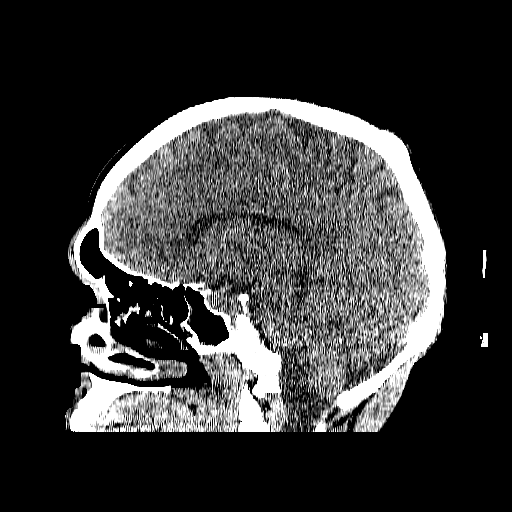
[im 41/91  bone]
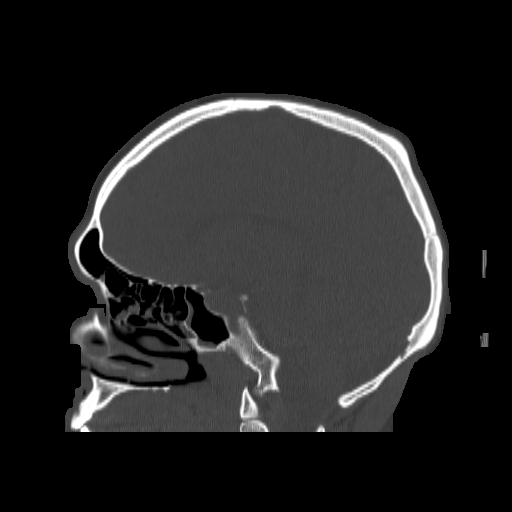
[im 50/91  bone]
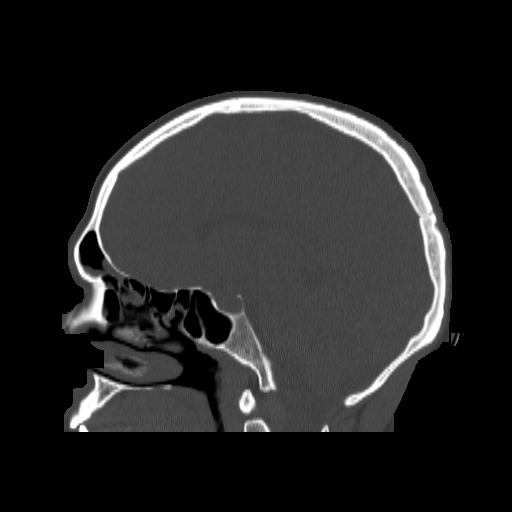
[im 58/91  bone]
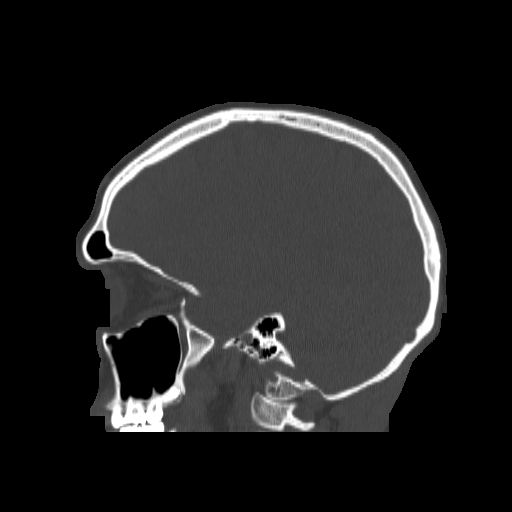
[im 66/91  bone]
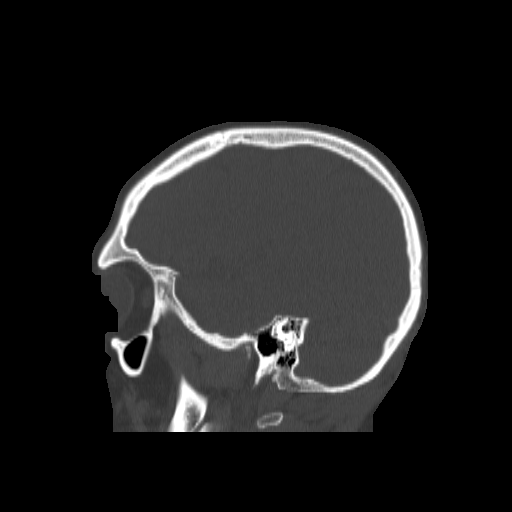
[im 74/91  brain]
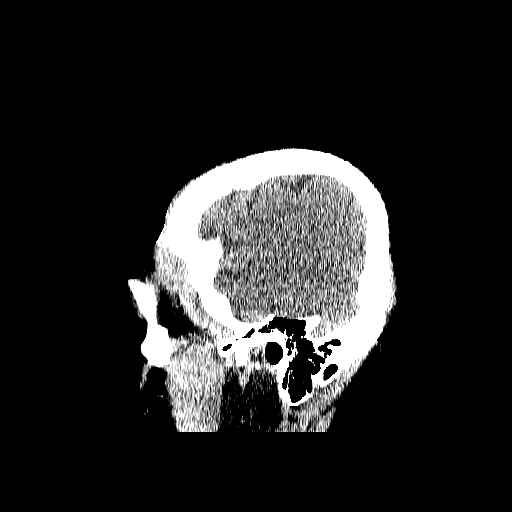
[im 74/91  bone]
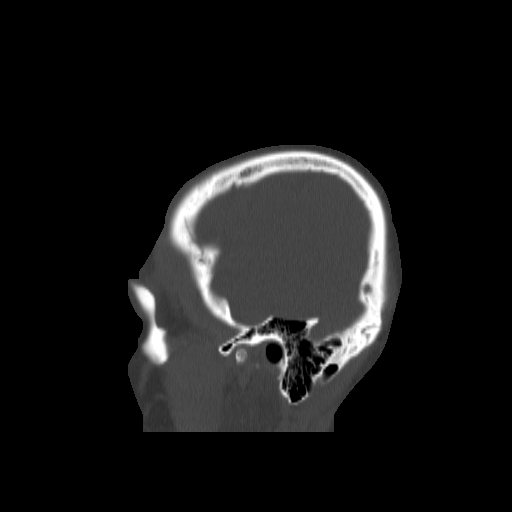
[im 82/91  bone]
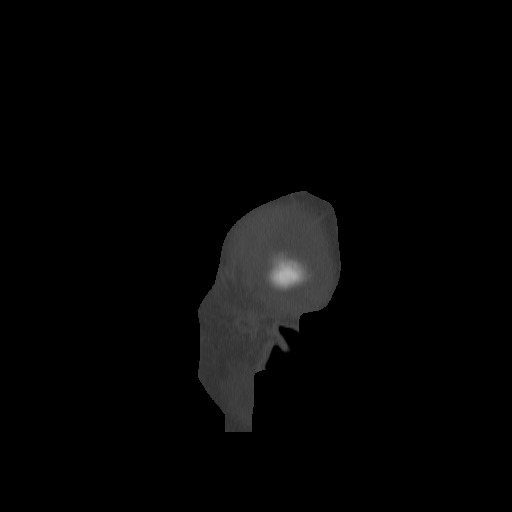

[16 of 30 positions shown; findings below may reference images not displayed]

FINDINGS: Mild mucosal thickening is present in the anterior and inferior
right maxillary sinus. There is some mucosal thickening within the
anterior ethmoid air cells bilaterally as well as the inferior and
medial aspect of the left frontal sinus. Mild mucosal thickening is
present along the anterior and inferior aspect of the right sphenoid
sinus. The left maxillary sinus, right frontal sinus, and left
sphenoid sinuses are clear.

Leftward nasal septal deviation measures 6 mm from the midline and
potentially impacts the left middle turbinate. The nasal cavity is
otherwise clear.
IMPRESSION: 1. Scattered mucosal thickening within the anterior inferior right
maxillary sinus, anterior ethmoid air cells bilaterally, inferior
and medial left frontal sinus, and anterior and inferior right
sphenoid sinus.
2. Leftward nasal septal deviation.
3. No fluid levels to suggest acute disease.
